# Patient Record
Sex: Male | Born: 1985 | Race: Black or African American | Hispanic: No | Marital: Married | Smoking: Current every day smoker
Health system: Southern US, Community
[De-identification: ages and names within clinical notes are randomized; demographics above are authoritative.]

---

## 2004-06-10 ENCOUNTER — Emergency Department (HOSPITAL_COMMUNITY): Admission: EM | Admit: 2004-06-10 | Discharge: 2004-06-10 | Payer: Self-pay | Admitting: Emergency Medicine

## 2006-04-07 ENCOUNTER — Emergency Department (HOSPITAL_COMMUNITY): Admission: EM | Admit: 2006-04-07 | Discharge: 2006-04-07 | Payer: Self-pay | Admitting: Emergency Medicine

## 2010-03-13 ENCOUNTER — Emergency Department (HOSPITAL_COMMUNITY): Admission: EM | Admit: 2010-03-13 | Discharge: 2010-03-13 | Payer: Self-pay | Admitting: Emergency Medicine

## 2010-08-22 LAB — POCT I-STAT, CHEM 8
Calcium, Ion: 1.15 mmol/L (ref 1.12–1.32)
Glucose, Bld: 81 mg/dL (ref 70–99)
HCT: 48 % (ref 39.0–52.0)
Hemoglobin: 16.3 g/dL (ref 13.0–17.0)
Potassium: 3.8 mEq/L (ref 3.5–5.1)

## 2011-09-06 ENCOUNTER — Ambulatory Visit (INDEPENDENT_AMBULATORY_CARE_PROVIDER_SITE_OTHER): Payer: 59 | Admitting: Family Medicine

## 2011-09-06 VITALS — BP 100/67 | HR 56 | Temp 98.2°F | Resp 18 | Ht 73.0 in | Wt 160.8 lb

## 2011-09-06 DIAGNOSIS — R509 Fever, unspecified: Secondary | ICD-10-CM

## 2011-09-06 DIAGNOSIS — Z720 Tobacco use: Secondary | ICD-10-CM

## 2011-09-06 DIAGNOSIS — K219 Gastro-esophageal reflux disease without esophagitis: Secondary | ICD-10-CM

## 2011-09-06 DIAGNOSIS — B9789 Other viral agents as the cause of diseases classified elsewhere: Secondary | ICD-10-CM

## 2011-09-06 MED ORDER — MELOXICAM 7.5 MG PO TABS: 7.5000 mg | ORAL_TABLET | Freq: Two times a day (BID) | ORAL | Status: AC

## 2011-09-06 MED ORDER — HYDROCODONE-HOMATROPINE 5-1.5 MG/5ML PO SYRP: 5.0000 mL | ORAL_SOLUTION | Freq: Three times a day (TID) | ORAL | Status: AC | PRN

## 2011-09-06 NOTE — Progress Notes (Signed)
  Subjective:    Patient ID: Dalton Cortez, male    DOB: Nov 08, 1985, 26 y.o.   MRN: [id number]  HPI  Patient presents with fever, ST and cough. Cough worse at night and is nonproductive Cabin crew with influenza  Never with history of seasonal allergies or wheezing History of GERD; daily Prilosec(OTC) to control symptoms Review of Systems     Objective:   Physical Exam    Results for orders placed in visit on 09/06/11  POCT RAPID STREP A (OFFICE)      Component Value Range   Rapid Strep A Screen Negative  Negative   POCT INFLUENZA A/B      Component Value Range   Influenza A, POC Negative     Influenza B, POC Negative         Assessment & Plan:   1. GERD (gastroesophageal reflux disease)    2. Viral illness  POCT rapid strep A, POCT Influenza A/B, meloxicam (MOBIC) 7.5 MG tablet, HYDROcodone-homatropine (HYCODAN) 5-1.5 MG/5ML syrup   Tamiflu 75mg  BID X 5 days Anticipatory guidance Call or RTC if symptoms persist or worsen

## 2011-12-27 ENCOUNTER — Ambulatory Visit (INDEPENDENT_AMBULATORY_CARE_PROVIDER_SITE_OTHER): Payer: 59 | Admitting: Emergency Medicine

## 2011-12-27 VITALS — BP 120/68 | HR 63 | Temp 98.0°F | Resp 17 | Ht 72.0 in | Wt 155.0 lb

## 2011-12-27 DIAGNOSIS — S0500XA Injury of conjunctiva and corneal abrasion without foreign body, unspecified eye, initial encounter: Secondary | ICD-10-CM

## 2011-12-27 DIAGNOSIS — S058X9A Other injuries of unspecified eye and orbit, initial encounter: Secondary | ICD-10-CM

## 2011-12-27 MED ORDER — TOBRAMYCIN 0.3 % OP SOLN: 1.0000 [drp] | OPHTHALMIC | Status: AC

## 2011-12-27 NOTE — Progress Notes (Signed)
   Date:  12/27/2011   Name:  Dalton Cortez   DOB:  May 23, 1986   MRN:  [id number]  PCP:  No primary provider on file.    Chief Complaint: Eye Pain   History of Present Illness:  Dalton Cortez is a 26 y.o. very pleasant male patient who presents with the following:  No history of injury or FB in left eye.  Awoke this morning with pain but no FB sensation.  Has been watering.  Pain less.  Red   Patient Active Problem List  Diagnosis  . GERD (gastroesophageal reflux disease)  . Tobacco abuse    No past medical history on file.  No past surgical history on file.  History  Substance Use Topics  . Smoking status: Current Everyday Smoker -- 0.5 packs/day for 10 years    Types: Cigarettes  . Smokeless tobacco: Not on file  . Alcohol Use: Not on file    No family history on file.  No Known Allergies  Medication list has been reviewed and updated.  Current Outpatient Prescriptions on File Prior to Visit  Medication Sig Dispense Refill  . meloxicam (MOBIC) 7.5 MG tablet Take 1 tablet (7.5 mg total) by mouth 2 (two) times daily.  30 tablet  0  . omeprazole (PRILOSEC) 20 MG capsule Take 20 mg by mouth daily.        Review of Systems:  As per HPI, otherwise negative.    Physical Examination: Filed Vitals:   12/27/11 1354  BP: 120/68  Pulse: 63  Temp: 98 F (36.7 C)  Resp: 17   Filed Vitals:   12/27/11 1354  Height: 6' (1.829 m)  Weight: 155 lb (70.308 kg)   Body mass index is 21.02 kg/(m^2). Ideal Body Weight: Weight in (lb) to have BMI = 25: 183.9    GEN: WDWN, NAD, Non-toxic, Alert & Oriented x 3 HEENT: Atraumatic, Normocephalic.  Ears and Nose: No external deformity. EXTR: No clubbing/cyanosis/edema NEURO: Normal gait.  PSYCH: Normally interactive. Conversant. Not depressed or anxious appearing.  Calm demeanor.  Left eye injected.  No abrasion or fb.  Fluorescein negative  Assessment and Plan: Healing abrasion vs conjunctivitis tobrex Follow up as  needed Carmelina Dane, MD

## 2012-08-28 ENCOUNTER — Ambulatory Visit (INDEPENDENT_AMBULATORY_CARE_PROVIDER_SITE_OTHER): Payer: BC Managed Care – PPO | Admitting: Family Medicine

## 2012-08-28 ENCOUNTER — Ambulatory Visit: Payer: BC Managed Care – PPO

## 2012-08-28 VITALS — BP 124/76 | HR 64 | Temp 98.2°F | Resp 16 | Ht 72.0 in | Wt 165.4 lb

## 2012-08-28 DIAGNOSIS — IMO0002 Reserved for concepts with insufficient information to code with codable children: Secondary | ICD-10-CM

## 2012-08-28 DIAGNOSIS — R509 Fever, unspecified: Secondary | ICD-10-CM

## 2012-08-28 DIAGNOSIS — M79609 Pain in unspecified limb: Secondary | ICD-10-CM

## 2012-08-28 DIAGNOSIS — M79645 Pain in left finger(s): Secondary | ICD-10-CM

## 2012-08-28 DIAGNOSIS — S62609A Fracture of unspecified phalanx of unspecified finger, initial encounter for closed fracture: Secondary | ICD-10-CM

## 2012-08-28 MED ORDER — HYDROCODONE-ACETAMINOPHEN 5-325 MG PO TABS: 1.0000 | ORAL_TABLET | Freq: Four times a day (QID) | ORAL | Status: DC | PRN
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
## 2012-08-28 NOTE — Patient Instructions (Addendum)
Return in 2 weeks to re-x-ray the finger and insure proper healing.  Keep the splint on 24-7

## 2012-09-11 ENCOUNTER — Ambulatory Visit: Payer: BC Managed Care – PPO

## 2012-11-12 ENCOUNTER — Encounter (HOSPITAL_COMMUNITY): Payer: Self-pay

## 2012-11-12 ENCOUNTER — Emergency Department (HOSPITAL_COMMUNITY)
Admission: EM | Admit: 2012-11-12 | Discharge: 2012-11-12 | Disposition: A | Discharge status: Home / Self Care | Payer: BC Managed Care – PPO | Attending: Emergency Medicine | Admitting: Emergency Medicine

## 2012-11-12 DIAGNOSIS — Z79899 Other long term (current) drug therapy: Secondary | ICD-10-CM | POA: Insufficient documentation

## 2012-11-12 DIAGNOSIS — R05 Cough: Secondary | ICD-10-CM | POA: Insufficient documentation

## 2012-11-12 DIAGNOSIS — F17200 Nicotine dependence, unspecified, uncomplicated: Secondary | ICD-10-CM | POA: Insufficient documentation

## 2012-11-12 DIAGNOSIS — R059 Cough, unspecified: Secondary | ICD-10-CM | POA: Insufficient documentation

## 2012-11-12 DIAGNOSIS — IMO0001 Reserved for inherently not codable concepts without codable children: Secondary | ICD-10-CM | POA: Insufficient documentation

## 2012-11-12 DIAGNOSIS — J069 Acute upper respiratory infection, unspecified: Secondary | ICD-10-CM

## 2012-11-12 DIAGNOSIS — J3489 Other specified disorders of nose and nasal sinuses: Secondary | ICD-10-CM | POA: Insufficient documentation

## 2012-11-12 MED ORDER — KETOROLAC TROMETHAMINE 60 MG/2ML IM SOLN
60.0000 mg | Freq: Once | INTRAMUSCULAR | Status: AC
  Administered 2012-11-12: 60 mg via INTRAMUSCULAR
  Filled 2012-11-12: qty 2

## 2012-11-12 MED ORDER — PREDNISONE 50 MG PO TABS: 50.0000 mg | ORAL_TABLET | Freq: Every day | ORAL | Status: DC

## 2012-11-12 MED ORDER — GUAIFENESIN ER 1200 MG PO TB12: 1.0000 | ORAL_TABLET | Freq: Two times a day (BID) | ORAL | Status: DC

## 2012-11-12 MED ORDER — ACETAMINOPHEN-CODEINE 120-12 MG/5ML PO SOLN: 10.0000 mL | ORAL | Status: DC | PRN

## 2012-11-12 NOTE — ED Provider Notes (Signed)
History     CSN: [id number]  Arrival date & time 11/12/12  1156   First MD Initiated Contact with Patient 11/12/12 1227      Chief Complaint  Patient presents with  . Headache  . Generalized Body Aches    (Consider location/radiation/quality/duration/timing/severity/associated sxs/prior treatment) HPI Patient presents emergency department with nasal congestion, cough, and headache.  Patient, states, that the symptoms started Wednesday evening.  Patient denies chest pain, shortness of breath, nausea, vomiting, abdominal pain, back pain, dysuria, blurred vision, dizziness, or syncope.  Patient, states he took 400 mg of ibuprofen with some relief of his headache.  The symptoms have been constant over that time    History reviewed. No pertinent past medical history.  History reviewed. No pertinent past surgical history.  History reviewed. No pertinent family history.  History  Substance Use Topics  . Smoking status: Current Every Day Smoker -- 0.50 packs/day for 10 years    Types: Cigarettes  . Smokeless tobacco: Never Used  . Alcohol Use: 7.2 oz/week    12 Cans of beer per week     Comment: socially      Review of Systems All other systems negative except as documented in the HPI. All pertinent positives and negatives as reviewed in the HPI. Allergies  Review of patient's allergies indicates no known allergies.  Home Medications   Current Outpatient Rx  Name  Route  Sig  Dispense  Refill  . ibuprofen (ADVIL,MOTRIN) 200 MG tablet   Oral   Take 400 mg by mouth every 6 (six) hours as needed for pain.         Marland Kitchen omeprazole (PRILOSEC) 20 MG capsule   Oral   Take 20 mg by mouth daily.           BP 119/72  Pulse 88  Temp(Src) 99 F (37.2 C) (Oral)  Resp 18  Ht 6\' 1"  (1.854 m)  Wt 165 lb (74.844 kg)  BMI 21.77 kg/m2  SpO2 100%  Physical Exam  Nursing note and vitals reviewed. Constitutional: He is oriented to person, place, and time. He appears  well-developed and well-nourished. No distress.  HENT:  Head: Normocephalic and atraumatic.  Nose: Rhinorrhea present.  Mouth/Throat: Oropharynx is clear and moist.  Eyes: EOM are normal. Pupils are equal, round, and reactive to light.  Neck: Normal range of motion. Neck supple.  Cardiovascular: Normal rate, regular rhythm and normal heart sounds.  Exam reveals no gallop and no friction rub.   No murmur heard. Pulmonary/Chest: Effort normal and breath sounds normal. No respiratory distress.  Neurological: He is alert and oriented to person, place, and time.  Skin: Skin is warm and dry. No rash noted.    ED Course  Procedures (including critical care time) Patient most likely has viral URI.  Told to increase fluid intake, rest as much possible.  Told to return here as needed   MDM          Carlyle Dolly, PA-C 11/12/12 1248

## 2012-11-12 NOTE — ED Notes (Signed)
Patient reports that he has had a headache, clear nasal drainage, body aches, and chills. Patiuent states that he took Advil 400 mg piror to arriving to the ED because he felt hot.

## 2012-11-16 NOTE — ED Provider Notes (Signed)
Medical screening examination/treatment/procedure(s) were performed by non-physician practitioner and as supervising physician I was immediately available for consultation/collaboration.  Toy Baker, MD 11/16/12 2009

## 2013-04-14 ENCOUNTER — Other Ambulatory Visit: Payer: Self-pay

## 2015-12-13 ENCOUNTER — Emergency Department (HOSPITAL_COMMUNITY): Payer: BLUE CROSS/BLUE SHIELD

## 2015-12-13 ENCOUNTER — Emergency Department (HOSPITAL_COMMUNITY)
Admission: EM | Admit: 2015-12-13 | Discharge: 2015-12-13 | Disposition: A | Discharge status: Home / Self Care | Payer: BLUE CROSS/BLUE SHIELD | Attending: Emergency Medicine | Admitting: Emergency Medicine

## 2015-12-13 ENCOUNTER — Encounter (HOSPITAL_COMMUNITY): Payer: Self-pay | Admitting: Emergency Medicine

## 2015-12-13 DIAGNOSIS — Y999 Unspecified external cause status: Secondary | ICD-10-CM | POA: Insufficient documentation

## 2015-12-13 DIAGNOSIS — F17210 Nicotine dependence, cigarettes, uncomplicated: Secondary | ICD-10-CM | POA: Insufficient documentation

## 2015-12-13 DIAGNOSIS — Y929 Unspecified place or not applicable: Secondary | ICD-10-CM | POA: Insufficient documentation

## 2015-12-13 DIAGNOSIS — S62316A Displaced fracture of base of fifth metacarpal bone, right hand, initial encounter for closed fracture: Secondary | ICD-10-CM | POA: Diagnosis not present

## 2015-12-13 DIAGNOSIS — S6991XA Unspecified injury of right wrist, hand and finger(s), initial encounter: Secondary | ICD-10-CM | POA: Diagnosis present

## 2015-12-13 DIAGNOSIS — Y939 Activity, unspecified: Secondary | ICD-10-CM | POA: Insufficient documentation

## 2015-12-13 DIAGNOSIS — W228XXA Striking against or struck by other objects, initial encounter: Secondary | ICD-10-CM | POA: Insufficient documentation

## 2015-12-13 DIAGNOSIS — S62308A Unspecified fracture of other metacarpal bone, initial encounter for closed fracture: Secondary | ICD-10-CM

## 2015-12-13 MED ORDER — KETOROLAC TROMETHAMINE 60 MG/2ML IM SOLN
60.0000 mg | Freq: Once | INTRAMUSCULAR | Status: AC
Start: 2015-12-13 — End: 2015-12-13
  Administered 2015-12-13: 60 mg via INTRAMUSCULAR
  Filled 2015-12-13: qty 2

## 2015-12-13 MED ORDER — OXYCODONE-ACETAMINOPHEN 5-325 MG PO TABS: 1.0000 | ORAL_TABLET | ORAL | Status: DC | PRN

## 2015-12-13 MED ORDER — IBUPROFEN 600 MG PO TABS: 600.0000 mg | ORAL_TABLET | Freq: Three times a day (TID) | ORAL | Status: DC
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
## 2015-12-13 NOTE — Progress Notes (Signed)
Orthopedic Tech Progress Note Patient Details:  Dalton Cortez 01/16/1986 [id number]  Ortho Devices Type of Ortho Device: Ace wrap, Ulna gutter splint Ortho Device/Splint Interventions: Application   [name] 12/13/2015, 9:33 AM

## 2015-12-17 ENCOUNTER — Other Ambulatory Visit: Payer: Self-pay | Admitting: Orthopaedic Surgery

## 2015-12-17 ENCOUNTER — Ambulatory Visit
Admission: RE | Admit: 2015-12-17 | Discharge: 2015-12-17 | Disposition: A | Discharge status: Home / Self Care | Payer: BLUE CROSS/BLUE SHIELD | Source: Ambulatory Visit | Attending: Orthopaedic Surgery | Admitting: Orthopaedic Surgery

## 2015-12-17 DIAGNOSIS — M79641 Pain in right hand: Secondary | ICD-10-CM

## 2016-10-13 ENCOUNTER — Encounter: Payer: Self-pay | Admitting: Urgent Care

## 2016-10-13 ENCOUNTER — Ambulatory Visit (INDEPENDENT_AMBULATORY_CARE_PROVIDER_SITE_OTHER): Payer: BLUE CROSS/BLUE SHIELD | Admitting: Urgent Care

## 2016-10-13 VITALS — BP 102/59 | HR 71 | Temp 98.3°F | Resp 16 | Ht 71.5 in | Wt 149.8 lb

## 2016-10-13 DIAGNOSIS — K029 Dental caries, unspecified: Secondary | ICD-10-CM

## 2016-10-13 DIAGNOSIS — R220 Localized swelling, mass and lump, head: Secondary | ICD-10-CM

## 2016-10-13 DIAGNOSIS — K0889 Other specified disorders of teeth and supporting structures: Secondary | ICD-10-CM | POA: Diagnosis not present

## 2016-10-13 MED ORDER — NAPROXEN SODIUM 550 MG PO TABS: 550.0000 mg | ORAL_TABLET | Freq: Two times a day (BID) | ORAL | 1 refills | Status: AC

## 2016-10-13 MED ORDER — AMOXICILLIN-POT CLAVULANATE 875-125 MG PO TABS: 1.0000 | ORAL_TABLET | Freq: Two times a day (BID) | ORAL | 0 refills | Status: AC

## 2016-10-13 NOTE — Patient Instructions (Addendum)
Please make sure you can set up an appointment with a dentist for management of a possible dental abscess. If you are not able to get an appointment this week, you must report to the ER immediately.   Dental Abscess A dental abscess is a collection of pus in or around a tooth. What are the causes? This condition is caused by a bacterial infection around the root of the tooth that involves the inner part of the tooth (pulp). It may result from:  Severe tooth decay.  Trauma to the tooth that allows bacteria to enter into the pulp, such as a broken or chipped tooth.  Severe gum disease around a tooth. What are the signs or symptoms? Symptoms of this condition include:  Severe pain in and around the infected tooth.  Swelling and redness around the infected tooth, in the mouth, or in the face.  Tenderness.  Pus drainage.  Bad breath.  Bitter taste in the mouth.  Difficulty swallowing.  Difficulty opening the mouth.  Nausea.  Vomiting.  Chills.  Swollen neck glands.  Fever. How is this diagnosed? This condition is diagnosed with examination of the infected tooth. During the exam, your dentist may tap on the infected tooth. Your dentist will also ask about your medical and dental history and may order X-rays. How is this treated? This condition is treated by eliminating the infection. This may be done with:  Antibiotic medicine.  A root canal. This may be performed to save the tooth.  Pulling (extracting) the tooth. This may also involve draining the abscess. This is done if the tooth cannot be saved. Follow these instructions at home:  Take medicines only as directed by your dentist.  If you were prescribed antibiotic medicine, finish all of it even if you start to feel better.  Rinse your mouth (gargle) often with salt water to relieve pain or swelling.  Do not drive or operate heavy machinery while taking pain medicine.  Do not apply heat to the outside of  your mouth.  Keep all follow-up visits as directed by your dentist. This is important. Contact a health care provider if:  Your pain is worse and is not helped by medicine. Get help right away if:  You have a fever or chills.  Your symptoms suddenly get worse.  You have a very bad headache.  You have problems breathing or swallowing.  You have trouble opening your mouth.  You have swelling in your neck or around your eye. This information is not intended to replace advice given to you by your health care provider. Make sure you discuss any questions you have with your health care provider. Document Released: 05/26/2005 Document Revised: 10/04/2015 Document Reviewed: 05/23/2014 Elsevier Interactive Patient Education  2017 [employer]     IF you received an x-ray today, you will receive an invoice from [facility]. Please contact [facility] at [phone] with questions or concerns regarding your invoice.   IF you received labwork today, you will receive an invoice from [facility]. Please contact [facility] at [phone] with questions or concerns regarding your invoice.   Our billing staff will not be able to assist you with questions regarding bills from these companies.  You will be contacted with the lab results as soon as they are available. The fastest way to get your results is to activate your My Chart account. Instructions are located on the last page of this paperwork. If you have not heard from us regarding the results in  2 weeks, please contact this office.

## 2016-10-13 NOTE — Progress Notes (Signed)
  MRN: [id number] DOB: 1985/10/04  Subjective:   Dalton Cortez is a 31 y.o. male presenting for chief complaint of Dental Pain (with swelling, left side)  Reports 5 day history of pain, worsened to swelling of his left lower teeth. Has had subjective fever, difficulty chewing. Has been drinking water daily. He has been to a dental office, recommended that he come in to a primary care office for an antibiotic course.   Dalton Cortez has a current medication list which includes the following prescription(s): omeprazole. Also has No Known Allergies. Dalton Cortez denies past medical and surgical history.   Objective:   Vitals: BP (!) 102/59 (BP Location: Right Arm, Patient Position: Sitting, Cuff Size: Normal)   Pulse 71   Temp 98.3 F (36.8 C) (Oral)   Resp 16   Ht 5' 11.5" (1.816 m)   Wt 149 lb 12.8 oz (67.9 kg)   SpO2 93%   BMI 20.60 kg/m   Physical Exam  Constitutional: He is oriented to person, place, and time. He appears well-developed and well-nourished.  HENT:  Head:    Mouth/Throat: Dental caries (over areas outlined) present.    Cardiovascular: Normal rate.   Pulmonary/Chest: Effort normal.  Neurological: He is alert and oriented to person, place, and time.   Assessment and Plan :   1. Pain, dental 2. Dental caries 3. Facial swelling - Start Augmentin as per UpToDate. Emphasized importance of setting up an appointment with a dentist. Otherwise, report to ER. Patient verbalized understanding.  Wallis BambergMario Kensleigh Gates, PA-C [facility] [phone] 10/13/2016  2:12 PM

## 2018-11-24 ENCOUNTER — Emergency Department (HOSPITAL_COMMUNITY): Payer: BLUE CROSS/BLUE SHIELD

## 2018-11-24 ENCOUNTER — Encounter (HOSPITAL_COMMUNITY): Payer: Self-pay | Admitting: *Deleted

## 2018-11-24 ENCOUNTER — Other Ambulatory Visit: Payer: Self-pay

## 2018-11-24 ENCOUNTER — Inpatient Hospital Stay (HOSPITAL_COMMUNITY)
Admission: EM | Admit: 2018-11-24 | Discharge: 2018-11-27 | DRG: 199 | Disposition: A | Discharge status: Home / Self Care | Payer: BLUE CROSS/BLUE SHIELD | Attending: General Surgery | Admitting: General Surgery

## 2018-11-24 DIAGNOSIS — S41112A Laceration without foreign body of left upper arm, initial encounter: Secondary | ICD-10-CM | POA: Diagnosis present

## 2018-11-24 DIAGNOSIS — S270XXA Traumatic pneumothorax, initial encounter: Secondary | ICD-10-CM

## 2018-11-24 DIAGNOSIS — S272XXA Traumatic hemopneumothorax, initial encounter: Principal | ICD-10-CM | POA: Diagnosis present

## 2018-11-24 DIAGNOSIS — J939 Pneumothorax, unspecified: Secondary | ICD-10-CM

## 2018-11-24 DIAGNOSIS — Z1159 Encounter for screening for other viral diseases: Secondary | ICD-10-CM

## 2018-11-24 DIAGNOSIS — S21412A Laceration without foreign body of left back wall of thorax with penetration into thoracic cavity, initial encounter: Secondary | ICD-10-CM | POA: Diagnosis present

## 2018-11-24 DIAGNOSIS — S21212A Laceration without foreign body of left back wall of thorax without penetration into thoracic cavity, initial encounter: Secondary | ICD-10-CM

## 2018-11-24 DIAGNOSIS — T148XXA Other injury of unspecified body region, initial encounter: Secondary | ICD-10-CM | POA: Diagnosis present

## 2018-11-24 DIAGNOSIS — D62 Acute posthemorrhagic anemia: Secondary | ICD-10-CM | POA: Diagnosis present

## 2018-11-24 LAB — I-STAT CHEM 8, ED
BUN: 16 mg/dL (ref 6–20)
Calcium, Ion: 1.08 mmol/L — ABNORMAL LOW (ref 1.15–1.40)
Chloride: 103 mmol/L (ref 98–111)
Creatinine, Ser: 0.8 mg/dL (ref 0.61–1.24)
Glucose, Bld: 124 mg/dL — ABNORMAL HIGH (ref 70–99)
HCT: 42 % (ref 39.0–52.0)
Hemoglobin: 14.3 g/dL (ref 13.0–17.0)
Potassium: 3.3 mmol/L — ABNORMAL LOW (ref 3.5–5.1)
Sodium: 139 mmol/L (ref 135–145)
TCO2: 24 mmol/L (ref 22–32)

## 2018-11-24 LAB — ABO/RH: ABO/RH(D): B POS

## 2018-11-24 MED ORDER — ONDANSETRON 4 MG PO TBDP: 4.0000 mg | ORAL_TABLET | Freq: Four times a day (QID) | ORAL | Status: DC | PRN

## 2018-11-24 MED ORDER — HYDROMORPHONE HCL 1 MG/ML IJ SOLN
0.5000 mg | INTRAMUSCULAR | Status: DC | PRN
  Administered 2018-11-24 – 2018-11-25 (×4): 0.5 mg via INTRAVENOUS
  Filled 2018-11-24 (×3): qty 0.5
  Filled 2018-11-24: qty 1

## 2018-11-24 MED ORDER — ONDANSETRON HCL 4 MG/2ML IJ SOLN
4.0000 mg | Freq: Four times a day (QID) | INTRAMUSCULAR | Status: DC | PRN
  Administered 2018-11-24: 4 mg via INTRAVENOUS
  Filled 2018-11-24: qty 2

## 2018-11-24 MED ORDER — IOHEXOL 350 MG/ML SOLN
100.0000 mL | Freq: Once | INTRAVENOUS | Status: AC | PRN
  Administered 2018-11-24: 100 mL via INTRAVENOUS

## 2018-11-24 NOTE — ED Notes (Signed)
Dr Donne Hazel at  The bedside.  Sutures in the lt arm and the p;osterior chest laceration

## 2018-11-24 NOTE — ED Notes (Signed)
Chest  Tube inserted by dr Delight Hoh chest  ivsx x 2  One in each arm  Bleeding from atabwound lt arm  Lt posterior chest

## 2018-11-24 NOTE — ED Notes (Signed)
2125 t0 c-t  Returned from c-t  C/o pain  Pt remaions alert
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
## 2018-11-24 NOTE — ED Notes (Signed)
bp 90/ 62

## 2018-11-24 NOTE — ED Notes (Signed)
The pt is feeling better  His color is better  And he has stopped sweating  He remains alert
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
## 2018-11-24 NOTE — ED Notes (Signed)
Pt remains alert sweating profuse now
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
## 2018-11-24 NOTE — ED Notes (Signed)
Nasal 02 decreased to 2 liters sats in the 100s
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
## 2018-11-25 ENCOUNTER — Other Ambulatory Visit: Payer: Self-pay

## 2018-11-25 ENCOUNTER — Observation Stay (HOSPITAL_COMMUNITY): Payer: BLUE CROSS/BLUE SHIELD

## 2018-11-25 ENCOUNTER — Encounter (HOSPITAL_COMMUNITY): Payer: Self-pay | Admitting: Certified Registered Nurse Anesthetist

## 2018-11-25 DIAGNOSIS — S270XXA Traumatic pneumothorax, initial encounter: Secondary | ICD-10-CM | POA: Diagnosis present

## 2018-11-25 DIAGNOSIS — D62 Acute posthemorrhagic anemia: Secondary | ICD-10-CM | POA: Diagnosis present

## 2018-11-25 DIAGNOSIS — S272XXA Traumatic hemopneumothorax, initial encounter: Secondary | ICD-10-CM | POA: Diagnosis present

## 2018-11-25 DIAGNOSIS — S21412A Laceration without foreign body of left back wall of thorax with penetration into thoracic cavity, initial encounter: Secondary | ICD-10-CM | POA: Diagnosis present

## 2018-11-25 DIAGNOSIS — S41112A Laceration without foreign body of left upper arm, initial encounter: Secondary | ICD-10-CM | POA: Diagnosis present

## 2018-11-25 DIAGNOSIS — Z1159 Encounter for screening for other viral diseases: Secondary | ICD-10-CM | POA: Diagnosis not present

## 2018-11-25 LAB — BPAM RBC
Blood Product Expiration Date: 202007162359
Blood Product Expiration Date: 202007162359
Blood Product Expiration Date: 202007182359
Blood Product Expiration Date: 202007182359
ISSUE DATE / TIME: 202006172041
ISSUE DATE / TIME: 202006172041
ISSUE DATE / TIME: 202006180131
ISSUE DATE / TIME: 202006180432
Unit Type and Rh: 5100
Unit Type and Rh: 5100
Unit Type and Rh: 5100
Unit Type and Rh: 5100

## 2018-11-25 LAB — PREPARE FRESH FROZEN PLASMA
Unit division: 0
Unit division: 0
Unit division: 0
Unit division: 0

## 2018-11-25 LAB — CBC
HCT: 33.5 % — ABNORMAL LOW (ref 39.0–52.0)
HCT: 32 % — ABNORMAL LOW (ref 39.0–52.0)
Hemoglobin: 11.3 g/dL — ABNORMAL LOW (ref 13.0–17.0)
Hemoglobin: 10.9 g/dL — ABNORMAL LOW (ref 13.0–17.0)
MCH: 30.7 pg (ref 26.0–34.0)
MCH: 30.8 pg (ref 26.0–34.0)
MCHC: 33.7 g/dL (ref 30.0–36.0)
MCHC: 34.1 g/dL (ref 30.0–36.0)
MCV: 91 fL (ref 80.0–100.0)
MCV: 90.4 fL (ref 80.0–100.0)
Platelets: 110 10*3/uL — ABNORMAL LOW (ref 150–400)
Platelets: 112 10*3/uL — ABNORMAL LOW (ref 150–400)
RBC: 3.68 MIL/uL — ABNORMAL LOW (ref 4.22–5.81)
RBC: 3.54 MIL/uL — ABNORMAL LOW (ref 4.22–5.81)
RDW: 12.6 % (ref 11.5–15.5)
RDW: 12.6 % (ref 11.5–15.5)
WBC: 11.6 10*3/uL — ABNORMAL HIGH (ref 4.0–10.5)
WBC: 9.3 10*3/uL (ref 4.0–10.5)
nRBC: 0 % (ref 0.0–0.2)
nRBC: 0 % (ref 0.0–0.2)

## 2018-11-25 LAB — TYPE AND SCREEN
ABO/RH(D): B POS
Antibody Screen: NEGATIVE
Unit division: 0
Unit division: 0
Unit division: 0
Unit division: 0

## 2018-11-25 LAB — BPAM FFP
Blood Product Expiration Date: 202007042359
Blood Product Expiration Date: 202007042359
Blood Product Expiration Date: 202007092359
Blood Product Expiration Date: 202007092359
ISSUE DATE / TIME: 202006172041
ISSUE DATE / TIME: 202006172041
ISSUE DATE / TIME: 202006172121
ISSUE DATE / TIME: 202006172121
Unit Type and Rh: 6200
Unit Type and Rh: 6200
Unit Type and Rh: 6200
Unit Type and Rh: 6200

## 2018-11-25 LAB — SARS CORONAVIRUS 2: SARS Coronavirus 2: NOT DETECTED

## 2018-11-25 LAB — BASIC METABOLIC PANEL
Anion gap: 7 (ref 5–15)
BUN: 12 mg/dL (ref 6–20)
CO2: 26 mmol/L (ref 22–32)
Calcium: 8 mg/dL — ABNORMAL LOW (ref 8.9–10.3)
Chloride: 106 mmol/L (ref 98–111)
Creatinine, Ser: 0.72 mg/dL (ref 0.61–1.24)
GFR calc Af Amer: >60 mL/min (ref >60)
GFR calc non Af Amer: >60 mL/min (ref >60)
Glucose, Bld: 95 mg/dL (ref 70–99)
Potassium: 3.9 mmol/L (ref 3.5–5.1)
Sodium: 139 mmol/L (ref 135–145)

## 2018-11-25 LAB — BLOOD PRODUCT ORDER (VERBAL) VERIFICATION

## 2018-11-25 LAB — MRSA PCR SCREENING: MRSA by PCR: NEGATIVE

## 2018-11-25 MED ORDER — HYDROMORPHONE HCL 1 MG/ML IJ SOLN
1.0000 mg | Freq: Once | INTRAMUSCULAR | Status: AC
  Administered 2018-11-25: 1 mg via INTRAVENOUS
  Filled 2018-11-25: qty 1

## 2018-11-25 MED ORDER — SODIUM CHLORIDE 0.9 % IV SOLN
INTRAVENOUS | Status: DC
  Administered 2018-11-25: 01:00:00 via INTRAVENOUS

## 2018-11-25 MED ORDER — ACETAMINOPHEN 325 MG PO TABS
650.0000 mg | ORAL_TABLET | ORAL | Status: DC | PRN
  Administered 2018-11-25 (×4): 650 mg via ORAL
  Filled 2018-11-25 (×4): qty 2

## 2018-11-25 MED ORDER — HYDROMORPHONE HCL 1 MG/ML IJ SOLN
1.0000 mg | INTRAMUSCULAR | Status: DC | PRN
  Administered 2018-11-25 – 2018-11-26 (×5): 1 mg via INTRAVENOUS
  Filled 2018-11-25 (×5): qty 1

## 2018-11-25 MED ORDER — LIDOCAINE HCL (PF) 1 % IJ SOLN
INTRAMUSCULAR | Status: AC
  Administered 2018-11-25: 09:00:00 via INTRADERMAL
  Filled 2018-11-25: qty 15

## 2018-11-25 MED ORDER — LIDOCAINE HCL 1 % IJ SOLN
15.0000 mL | INTRAMUSCULAR | Status: AC
  Administered 2018-11-25: 09:00:00 via INTRADERMAL
  Filled 2018-11-25: qty 15

## 2018-11-25 MED ORDER — CEFAZOLIN SODIUM-DEXTROSE 2-4 GM/100ML-% IV SOLN
2.0000 g | Freq: Once | INTRAVENOUS | Status: AC
  Administered 2018-11-25: 2 g via INTRAVENOUS
  Filled 2018-11-25: qty 100

## 2018-11-25 MED ORDER — OXYCODONE HCL 5 MG PO TABS
10.0000 mg | ORAL_TABLET | ORAL | Status: DC | PRN
  Administered 2018-11-25 – 2018-11-27 (×4): 10 mg via ORAL
  Filled 2018-11-25 (×4): qty 2

## 2018-11-25 MED ORDER — OXYCODONE HCL 5 MG PO TABS
5.0000 mg | ORAL_TABLET | ORAL | Status: DC | PRN
  Administered 2018-11-25 (×2): 5 mg via ORAL
  Filled 2018-11-25 (×2): qty 1

## 2018-11-25 MED ORDER — PNEUMOCOCCAL VAC POLYVALENT 25 MCG/0.5ML IJ INJ: 0.5000 mL | INJECTION | INTRAMUSCULAR | Status: DC | PRN

## 2018-11-25 MED ORDER — CHLORHEXIDINE GLUCONATE CLOTH 2 % EX PADS
6.0000 | MEDICATED_PAD | Freq: Every day | CUTANEOUS | Status: DC
  Administered 2018-11-25 – 2018-11-27 (×3): 6 via TOPICAL

## 2018-11-25 NOTE — ED Notes (Signed)
Unsuccessful attempt to call report to 2h  rn is weaning another pt will call back in 10 minutes
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
## 2018-11-25 NOTE — ED Notes (Signed)
Nasal 02 removed pts sats are 96%
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
## 2018-11-25 NOTE — ED Notes (Signed)
Rep;ort given to rn on 2h 

## 2018-11-26 ENCOUNTER — Inpatient Hospital Stay (HOSPITAL_COMMUNITY): Payer: BLUE CROSS/BLUE SHIELD

## 2018-11-26 LAB — CBC
HCT: 33.5 % — ABNORMAL LOW (ref 39.0–52.0)
Hemoglobin: 11.5 g/dL — ABNORMAL LOW (ref 13.0–17.0)
MCH: 31.1 pg (ref 26.0–34.0)
MCHC: 34.3 g/dL (ref 30.0–36.0)
MCV: 90.5 fL (ref 80.0–100.0)
Platelets: 115 10*3/uL — ABNORMAL LOW (ref 150–400)
RBC: 3.7 MIL/uL — ABNORMAL LOW (ref 4.22–5.81)
RDW: 12.6 % (ref 11.5–15.5)
WBC: 7.5 10*3/uL (ref 4.0–10.5)
nRBC: 0 % (ref 0.0–0.2)

## 2018-11-26 MED ORDER — HYDROMORPHONE HCL 1 MG/ML IJ SOLN
1.0000 mg | INTRAMUSCULAR | Status: DC | PRN
  Administered 2018-11-26: 1 mg via INTRAVENOUS
  Filled 2018-11-26: qty 1

## 2018-11-26 MED ORDER — ACETAMINOPHEN 500 MG PO TABS
1000.0000 mg | ORAL_TABLET | Freq: Four times a day (QID) | ORAL | Status: DC
  Administered 2018-11-26 – 2018-11-27 (×5): 1000 mg via ORAL
  Filled 2018-11-26 (×5): qty 2

## 2018-11-26 MED ORDER — TRAMADOL HCL 50 MG PO TABS
50.0000 mg | ORAL_TABLET | Freq: Four times a day (QID) | ORAL | Status: DC
  Administered 2018-11-26 – 2018-11-27 (×6): 50 mg via ORAL
  Filled 2018-11-26 (×6): qty 1

## 2018-11-26 NOTE — Discharge Instructions (Signed)
PNEUMOTHORAX OR HEMOTHORAX HOME INSTRUCTIONS   1. PAIN CONTROL:  1. Pain is best controlled by a usual combination of three different methods TOGETHER:  i. Ice/Heat ii. Over the counter pain medication iii. Prescription pain medication 2. You may experience some swelling and bruising in area of broken ribs. Ice packs or heating pads (30-60 minutes up to 6 times a day) will help. Use ice for the first few days to help decrease swelling and bruising, then switch to heat to help relax tight/sore spots and speed recovery. Some people prefer to use ice alone, heat alone, alternating between ice & heat. Experiment to what works for you. Swelling and bruising can take several weeks to resolve.  3. It is helpful to take an over-the-counter pain medication regularly for the first few weeks. Choose one of the following that works best for you:  i. Naproxen (Aleve, etc) Two 220mg  tabs twice a day ii. Ibuprofen (Advil, etc) Three 200mg  tabs four times a day (every meal & bedtime) iii. Acetaminophen (Tylenol, etc) 500-650mg  four times a day (every meal & bedtime) 4. A prescription for pain medication (such as oxycodone, hydrocodone, etc) may be given to you upon discharge. Take your pain medication as prescribed.  i. If you are having problems/concerns with the prescription medicine (does not control pain, nausea, vomiting, rash, itching, etc), please call us [phone] to see if we need to switch you to a different pain medicine that will work better for you and/or control your side effect better. ii. If you need a refill on your pain medication, please contact your pharmacy. They will contact our office to request authorization. Prescriptions will not be filled after 5 pm or on week-ends. 1. Avoid getting constipated. When taking pain medications, it is common to experience some constipation. Increasing fluid intake and taking a fiber supplement (such as Metamucil, Citrucel, FiberCon, MiraLax, etc) 1-2  times a day regularly will usually help prevent this problem from occurring. A mild laxative (prune juice, Milk of Magnesia, MiraLax, etc) should be taken according to package directions if there are no bowel movements after 48 hours.  2. Watch out for diarrhea. If you have many loose bowel movements, simplify your diet to bland foods & liquids for a few days. Stop any stool softeners and decrease your fiber supplement. Switching to mild anti-diarrheal medications (Kayopectate, Pepto Bismol) can help. If this worsens or does not improve, please call us. 3. Chest tube site wound: you may remove the dressing from your chest tube site 3 days after the removal of your chest tube. DO NOT shower over the dressing. Once   removed, you may shower as normal. Do not submerge your wound in water for 2-3 weeks. Okay to shower over stab wounds then cover with clean gauze.  4. FOLLOW UP  a. Please call our office to set up or confirm an appointment for follow up for 2 weeks after discharge. You will need to [facility] at either [facility] or [facility]. This will be outlined in your follow up instructions. Please call [facility] at [phone] if you have any questions about follow up.  b. If you have any orthopedic or other injuries you will need to follow up as outlined in your follow up instructions.   WHEN TO CALL US [phone]) [phone]:  1. Poor pain control 2. Reactions / problems with new medications (rash/itching, nausea, etc)  3. Fever over 101.5 F (38.5 C) 4. Worsening swelling or bruising 5.  Redness, drainage, pain or swelling around chest tube site or stab wounds 6. Worsening pain, productive cough, difficulty breathing or any other concerning symptoms  The clinic staff is available to answer your questions during regular business hours (8:30am-5pm). Please dont hesitate to call and ask to speak to one of our nurses for clinical concerns.  If you have a medical emergency, go to the nearest  emergency room or call 911.  A surgeon from [facility] is always on call at the [facility]   [facility], [place]  [address], [address], [place], [place] [address] ?  MAIN: ([phone]) [phone] ? TOLL FREE: [phone] ?  FAX [phone](336) [phone]  [email or web address]      Information on Rib Fractures  A rib fracture is a break or crack in one of the bones of the ribs. The ribs are long, curved bones that wrap around your chest and attach to your spine and your breastbone. The ribs protect your heart, lungs, and other organs in the chest. A broken or cracked rib is often painful but is not usually serious. Most rib fractures heal on their own over time. However, rib fractures can be more serious if multiple ribs are broken or if broken ribs move out of place and push against other structures or organs. What are the causes? This condition is caused by:  Repetitive movements with high force, such as pitching a baseball or having severe coughing spells.  A direct blow to the chest, such as a sports injury, a car accident, or a fall.  Cancer that has spread to the bones, which can weaken bones and cause them to break. What are the signs or symptoms? Symptoms of this condition include:  Pain when you breathe in or cough.  Pain when someone presses on the injured area.  Feeling short of breath. How is this diagnosed? This condition is diagnosed with a physical exam and medical history. Imaging tests may also be done, such as:  Chest X-ray.  CT scan.  MRI.  Bone scan.  Chest ultrasound. How is this treated? Treatment for this condition depends on the severity of the fracture. Most rib fractures usually heal on their own in 1-3 months. Sometimes healing takes longer if there is a cough that does not stop or if there are other activities that make the injury worse (aggravating factors). While you heal, you will be given medicines to control the  pain. You will also be taught deep breathing exercises. Severe injuries may require hospitalization or surgery. Follow these instructions at home: Managing pain, stiffness, and swelling  If directed, apply ice to the injured area. ? Put ice in a plastic bag. ? Place a towel between your skin and the bag. ? Leave the ice on for 20 minutes, 2-3 times a day.  Take over-the-counter and prescription medicines only as told by your health care provider. Activity  Avoid a lot of activity and any activities or movements that cause pain. Be careful during activities and avoid bumping the injured rib.  Slowly increase your activity as told by your health care provider. General instructions  Do deep breathing exercises as told by your health care provider. This helps prevent pneumonia, which is a common complication of a broken rib. Your health care provider may instruct you to: ? Take deep breaths several times a day. ? Try to cough several times a day, holding a pillow against the injured area. ? Use a device called incentive spirometer to  practice deep breathing several times a day.  Drink enough fluid to keep your urine pale yellow.  Do not wear a rib belt or binder. These restrict breathing, which can lead to pneumonia.  Keep all follow-up visits as told by your health care provider. This is important. Contact a health care provider if:  You have a fever. Get help right away if:  You have difficulty breathing or you are short of breath.  You develop a cough that does not stop, or you cough up thick or bloody sputum.  You have nausea, vomiting, or pain in your abdomen.  Your pain gets worse and medicine does not help. Summary  A rib fracture is a break or crack in one of the bones of the ribs.  A broken or cracked rib is often painful but is not usually serious.  Most rib fractures heal on their own over time.  Treatment for this condition depends on the severity of the  fracture.  Avoid a lot of activity and any activities or movements that cause pain. This information is not intended to replace advice given to you by your health care provider. Make sure you discuss any questions you have with your health care provider. Document Released: 05/26/2005 Document Revised: 08/25/2016 Document Reviewed: 08/25/2016 Elsevier Interactive Patient Education  2019 [employer]    Pneumothorax A pneumothorax is commonly called a collapsed lung. It is a condition in which air leaks from a lung and builds up between the thin layer of tissue that covers the lungs (visceral pleura) and the interior wall of the chest cavity (parietal pleura). The air gets trapped outside the lung, between the lung and the chest wall (pleural space). The air takes up space and prevents the lung from fully expanding. This condition sometimes occurs suddenly with no apparent cause. The buildup of air may be small or large. A small pneumothorax may go away on its own. A large pneumothorax will require treatment and hospitalization. What are the causes? This condition may be caused by:  Trauma and injury to the chest wall.  Surgery and other medical procedures.  A complication of an underlying lung problem, especially chronic obstructive pulmonary disease (COPD) or emphysema. Sometimes the cause of this condition is not known. What increases the risk? You are more likely to develop this condition if:  You have an underlying lung problem.  You smoke.  You are 61-65 years old, male, tall, and underweight.  You have a personal or family history of pneumothorax.  You have an eating disorder (anorexia nervosa). This condition can also happen quickly, even in people with no history of lung problems. What are the signs or symptoms? Sometimes a pneumothorax will have no symptoms. When symptoms are present, they can include:  Chest pain.  Shortness of breath.  Increased rate of  breathing.  Bluish color to your lips or skin (cyanosis). How is this diagnosed? This condition may be diagnosed by:  A medical history and physical exam.  A chest X-ray, chest CT scan, or ultrasound. How is this treated? Treatment depends on how severe your condition is. The goal of treatment is to remove the extra air and allow your lung to expand back to its normal size.  For a small pneumothorax: ? No treatment may be needed. ? Extra oxygen is sometimes used to make it go away more quickly.  For a large pneumothorax or a pneumothorax that is causing symptoms, a procedure is done to drain the air from  your lungs. To do this, a health care provider may use: ? A needle with a syringe. This is used to suck air from a pleural space where no additional leakage is taking place. ? A chest tube. This is used to suck air where there is ongoing leakage into the pleural space. The chest tube may need to remain in place for several days until the air leak has healed.  In more severe cases, surgery may be needed to repair the damage that is causing the leak.  If you have multiple pneumothorax episodes or have an air leak that will not heal, a procedure called a pleurodesis may be done. A medicine is placed in the pleural space to irritate the tissues around the lung so that the lung will stick to the chest wall, seal any leaks, and stop any buildup of air in that space. If you have an underlying lung problem, severe symptoms, or a large pneumothorax you will usually need to stay in the hospital. Follow these instructions at home: Lifestyle  Do not use any products that contain nicotine or tobacco, such as cigarettes and e-cigarettes. These are major risk factors in pneumothorax. If you need help quitting, ask your health care provider.  Do not lift anything that is heavier than 10 lb (4.5 kg), or the limit that your health care provider tells you, until he or she says that it is safe.  Avoid  activities that take a lot of effort (strenuous) for as long as told by your health care provider.  Return to your normal activities as told by your health care provider. Ask your health care provider what activities are safe for you.  Do not fly in an airplane or scuba dive until your health care provider says it is okay. General instructions  Take over-the-counter and prescription medicines only as told by your health care provider.  If a cough or pain makes it difficult for you to sleep at night, try sleeping in a semi-upright position in a recliner or by using 2 or 3 pillows.  If you had a chest tube and it was removed, ask your health care provider when you can remove the bandage (dressing). While the dressing is in place, do not allow it to get wet.  Keep all follow-up visits as told by your health care provider. This is important. Contact a health care provider if:  You cough up thick mucus (sputum) that is yellow or green in color.  You were treated with a chest tube, and you have redness, increasing pain, or discharge at the site where it was placed. Get help right away if:  You have increasing chest pain or shortness of breath.  You have a cough that will not go away.  You begin coughing up blood.  You have pain that is getting worse or is not controlled with medicines.  The site where your chest tube was located opens up.  You feel air coming out of the site where the chest tube was placed.  You have a fever or persistent symptoms for more than 2-3 days.  You have a fever and your symptoms suddenly get worse. These symptoms may represent a serious problem that is an emergency. Do not wait to see if the symptoms will go away. Get medical help right away. Call your local emergency services (911 in the [place] Do not drive yourself to the hospital. Summary  A pneumothorax, commonly called a collapsed lung, is a condition in which  air leaks from a lung and gets trapped  between the lung and the chest wall (pleural space).  The buildup of air may be small or large. A small pneumothorax may go away on its own. A large pneumothorax will require treatment and hospitalization.  Treatment for this condition depends on how severe the pneumothorax is. The goal of treatment is to remove the extra air and allow the lung to expand back to its normal size. This information is not intended to replace advice given to you by your health care provider. Make sure you discuss any questions you have with your health care provider. Document Released: 05/26/2005 Document Revised: 05/04/2017 Document Reviewed: 05/04/2017 Elsevier Interactive Patient Education  2019 [employer] Inc.    Sutured Wound Care Sutures are stitches that can be used to close wounds. Taking care of your wound properly can help to prevent pain and infection. It can also help your wound to heal more quickly. Follow instructions from your health care provider about how to care for your sutured wound. Supplies needed:  Soap and water.  A clean bandage (dressing), if needed.  Antibiotic ointment.  A clean towel. How to care for your sutured wound   Keep the wound completely dry for the first 24 hours, or for as long as directed by your health care provider. After 24-48 hours, you may shower or bathe as directed by your health care provider. Do not soak or submerge the wound in water until the sutures have been removed.  After the first 24 hours, clean the wound once a day, or as often as directed by your health care provider, using the following steps: ? Wash the wound with soap and water. ? Rinse the wound with water to remove all soap. ? Pat the wound dry with a clean towel. Do not rub the wound.  After cleaning the wound, apply a thin layer of antibiotic ointment as directed by your health care provider. This will prevent infection and keep the dressing from sticking to the wound.  Follow  instructions from your health care provider about how to change your dressing: ? Wash your hands with soap and water. If soap and water are not available, use hand sanitizer. ? Change your dressing at least once a day, or as often as told by your health care provider. If your dressing gets wet or dirty, change it. ? Leave sutures and other skin closures, such as adhesive tape or skin glue, in place. These skin closures may need to stay in place for 2 weeks or longer. If adhesive strip edges start to loosen and curl up, you may trim the loose edges. Do not remove adhesive strips completely unless your health care provider tells you to do that.  Check your wound every day for signs of infection. Watch for: ? Redness, swelling, or pain. ? Fluid or blood. ? Warmth. ? Pus or a bad smell.  Have the sutures removed as directed by your health care provider. Follow these instructions at home: Medicines  Take or apply over-the-counter and prescription medicines only as told by your health care provider.  If you were prescribed an antibiotic medicine or ointment, take or apply it as told by your health care provider. Do not stop using the antibiotic even if your condition improves. General instructions  To help reduce scarring after your wound heals, cover your wound with clothing or apply sunscreen of at least 30 SPF whenever you are outside.  Do not scratch or pick  at your wound.  Avoid stretching your wound.  Raise (elevate) the injured area above the level of your heart while you are sitting or lying down, if possible.  Drink enough fluids to keep your urine clear or pale yellow.  Keep all follow-up visits as told by your health care provider. This is important. Contact a health care provider if:  You received a tetanus shot and you have swelling, severe pain, redness, or bleeding at the injection site.  Your wound breaks open.  You have redness, swelling, or pain around your  wound.  You have fluid or blood coming from your wound.  Your wound feels warm to the touch.  You have a fever.  You notice something coming out of your wound, such as wood or glass.  You have pain that does not get better with medicine.  The skin near your wound changes color.  You need to change your dressing very frequently due to a lot of fluid, blood, or pus draining from the wound.  You develop a new rash.  You develop numbness around the wound. Get help right away if:  You develop severe swelling around your wound.  You have pus or a bad smell coming from your wound.  Your pain suddenly gets worse and is severe.  You develop painful lumps near your wound or anywhere on your body.  You have a red streak going away from your wound.  The wound is on your hand or foot and: ? You cannot properly move a finger or toe. ? Your fingers or toes look pale or bluish. ? You have numbness that is spreading down your hand, foot, fingers, or toes. Summary  Sutures are stitches that can be used to close wounds.  Taking care of your wound properly can help to prevent pain and infection.  Keep the wound completely dry for the first 24 hours, or for as long as directed by your health care provider. After 24-48 hours, you may shower or bathe as directed by your health care provider. This information is not intended to replace advice given to you by your health care provider. Make sure you discuss any questions you have with your health care provider. Document Released: 07/03/2004 Document Revised: 07/01/2016 Document Reviewed: 07/01/2016 Elsevier Interactive Patient Education  2019 [employer].

## 2018-11-26 NOTE — Social Work (Signed)
CSW acknowledging consult "Pt requested. regarding reason for being in hospital."   Spoke with trauma PA Janett Billow who will see pt, defer to medical team to discuss injuries and care plan.  Westley Hummer, MSW, [place] [employer] [phone]
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
## 2018-11-27 ENCOUNTER — Inpatient Hospital Stay (HOSPITAL_COMMUNITY): Payer: BLUE CROSS/BLUE SHIELD

## 2018-11-27 LAB — CBC
HCT: 34.8 % — ABNORMAL LOW (ref 39.0–52.0)
Hemoglobin: 11.9 g/dL — ABNORMAL LOW (ref 13.0–17.0)
MCH: 30.8 pg (ref 26.0–34.0)
MCHC: 34.2 g/dL (ref 30.0–36.0)
MCV: 90.2 fL (ref 80.0–100.0)
Platelets: 118 10*3/uL — ABNORMAL LOW (ref 150–400)
RBC: 3.86 MIL/uL — ABNORMAL LOW (ref 4.22–5.81)
RDW: 12.4 % (ref 11.5–15.5)
WBC: 6.6 10*3/uL (ref 4.0–10.5)
nRBC: 0 % (ref 0.0–0.2)

## 2018-11-27 MED ORDER — ACETAMINOPHEN 500 MG PO TABS: ORAL_TABLET | ORAL | 0 refills | Status: AC

## 2018-11-27 MED ORDER — IBUPROFEN 200 MG PO TABS: ORAL_TABLET | ORAL | Status: AC

## 2018-11-27 MED ORDER — IBUPROFEN 200 MG PO TABS: 600.0000 mg | ORAL_TABLET | Freq: Four times a day (QID) | ORAL | Status: DC | PRN

## 2018-11-27 MED ORDER — OXYCODONE HCL 10 MG PO TABS: 5.0000 mg | ORAL_TABLET | ORAL | 0 refills | Status: AC | PRN
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
## 2018-11-29 ENCOUNTER — Encounter: Payer: Self-pay | Admitting: Urgent Care

## 2018-12-03 ENCOUNTER — Ambulatory Visit
Admission: RE | Admit: 2018-12-03 | Discharge: 2018-12-03 | Disposition: A | Discharge status: Home / Self Care | Payer: BLUE CROSS/BLUE SHIELD | Source: Ambulatory Visit | Attending: General Surgery | Admitting: General Surgery

## 2018-12-03 ENCOUNTER — Other Ambulatory Visit: Payer: Self-pay | Admitting: General Surgery

## 2018-12-03 DIAGNOSIS — J939 Pneumothorax, unspecified: Secondary | ICD-10-CM
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
## 2020-07-17 IMAGING — DX PORTABLE CHEST - 1 VIEW
1 series · 1 of 1 positions shown · non-contrast
Comparison: Chest x-ray 11/24/2018

CLINICAL DATA: Follow-up pneumothorax.

EXAM:
PORTABLE CHEST 1 VIEW

[chest ap]
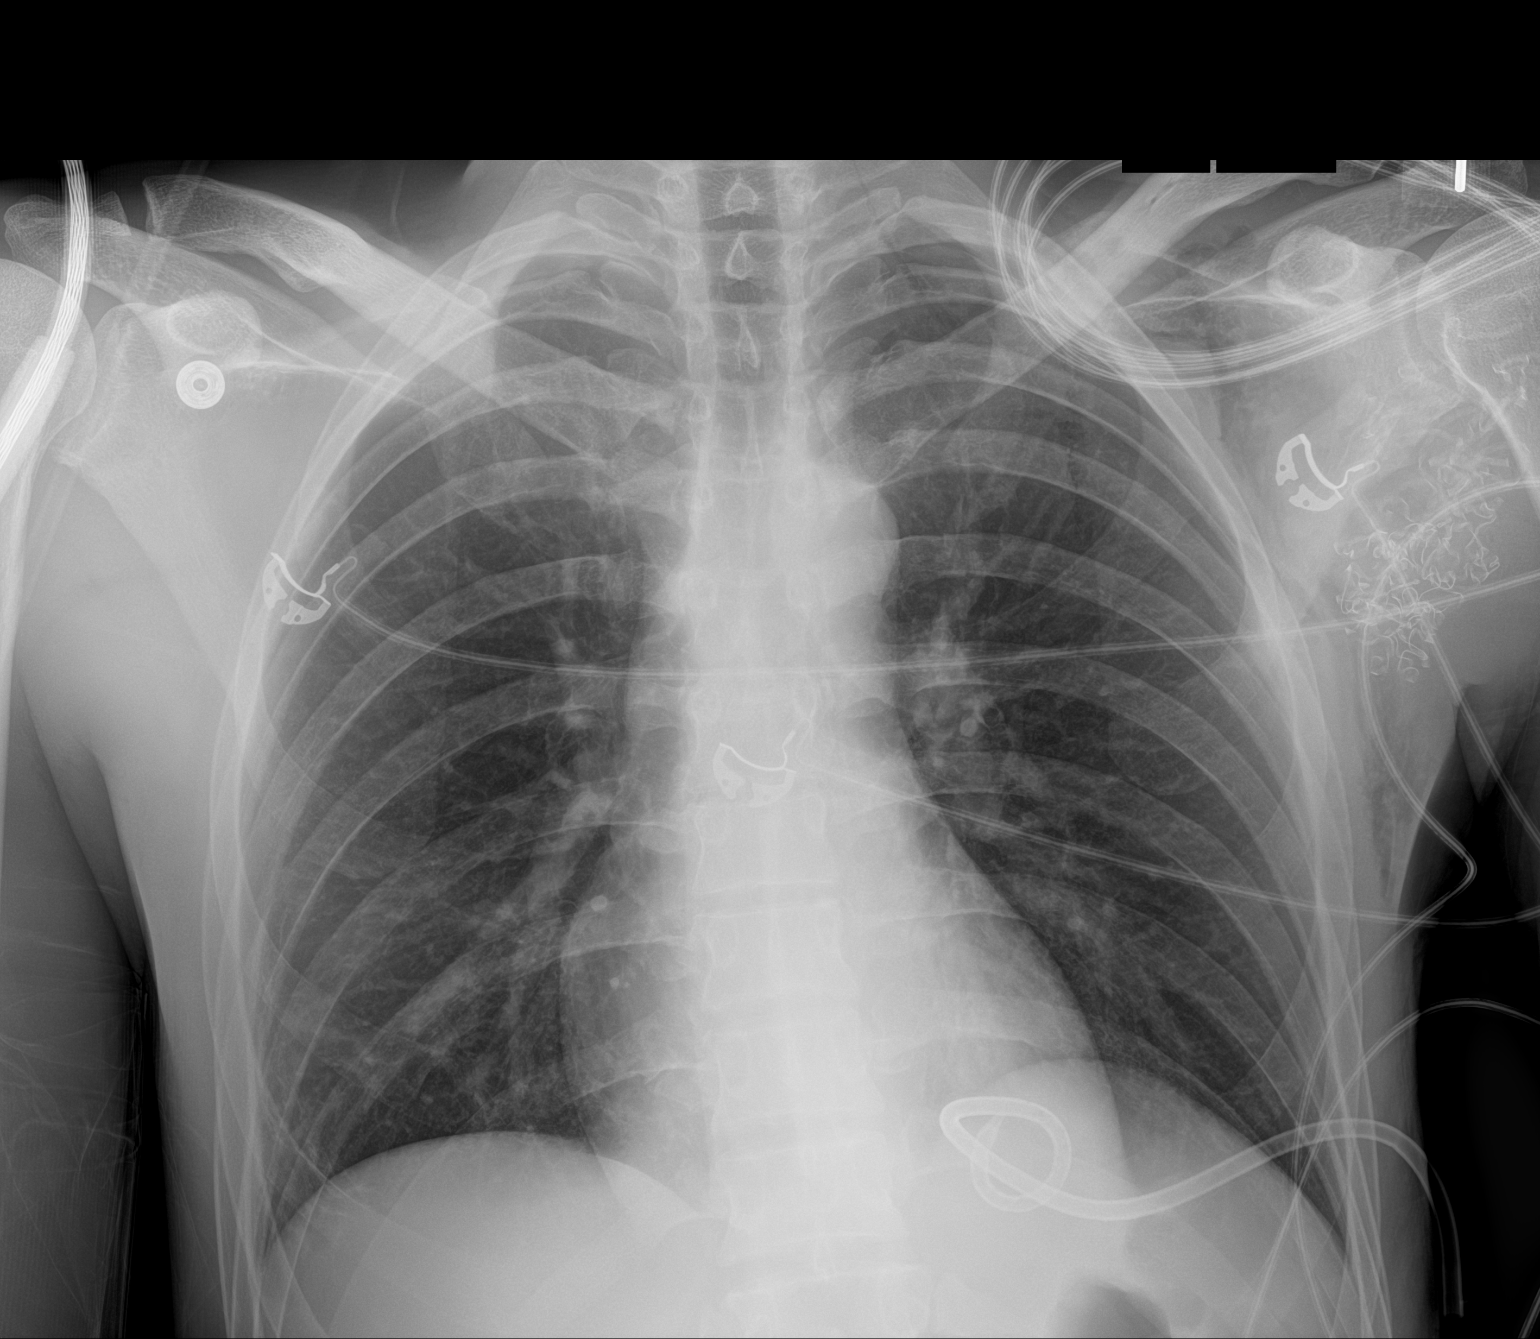

[1 of 1 positions shown; findings below may reference images not displayed]

FINDINGS: The left basilar chest tube is in good position. There is a tiny
residual left apical pneumothorax. No infiltrates or effusions. The
right lung is clear. The cardiomediastinal contours are normal in
stable.
IMPRESSION: Left-sided chest tube in good position with only a tiny residual
apical pneumothorax.

## 2020-07-19 IMAGING — DX PORTABLE CHEST - 1 VIEW
2 series · 2 of 2 positions shown · non-contrast
Comparison: 11/26/2018 and prior studies

CLINICAL DATA: Followup LEFT pneumothorax.

EXAM:
PORTABLE CHEST 1 VIEW

[chest ap (1 of 2)]
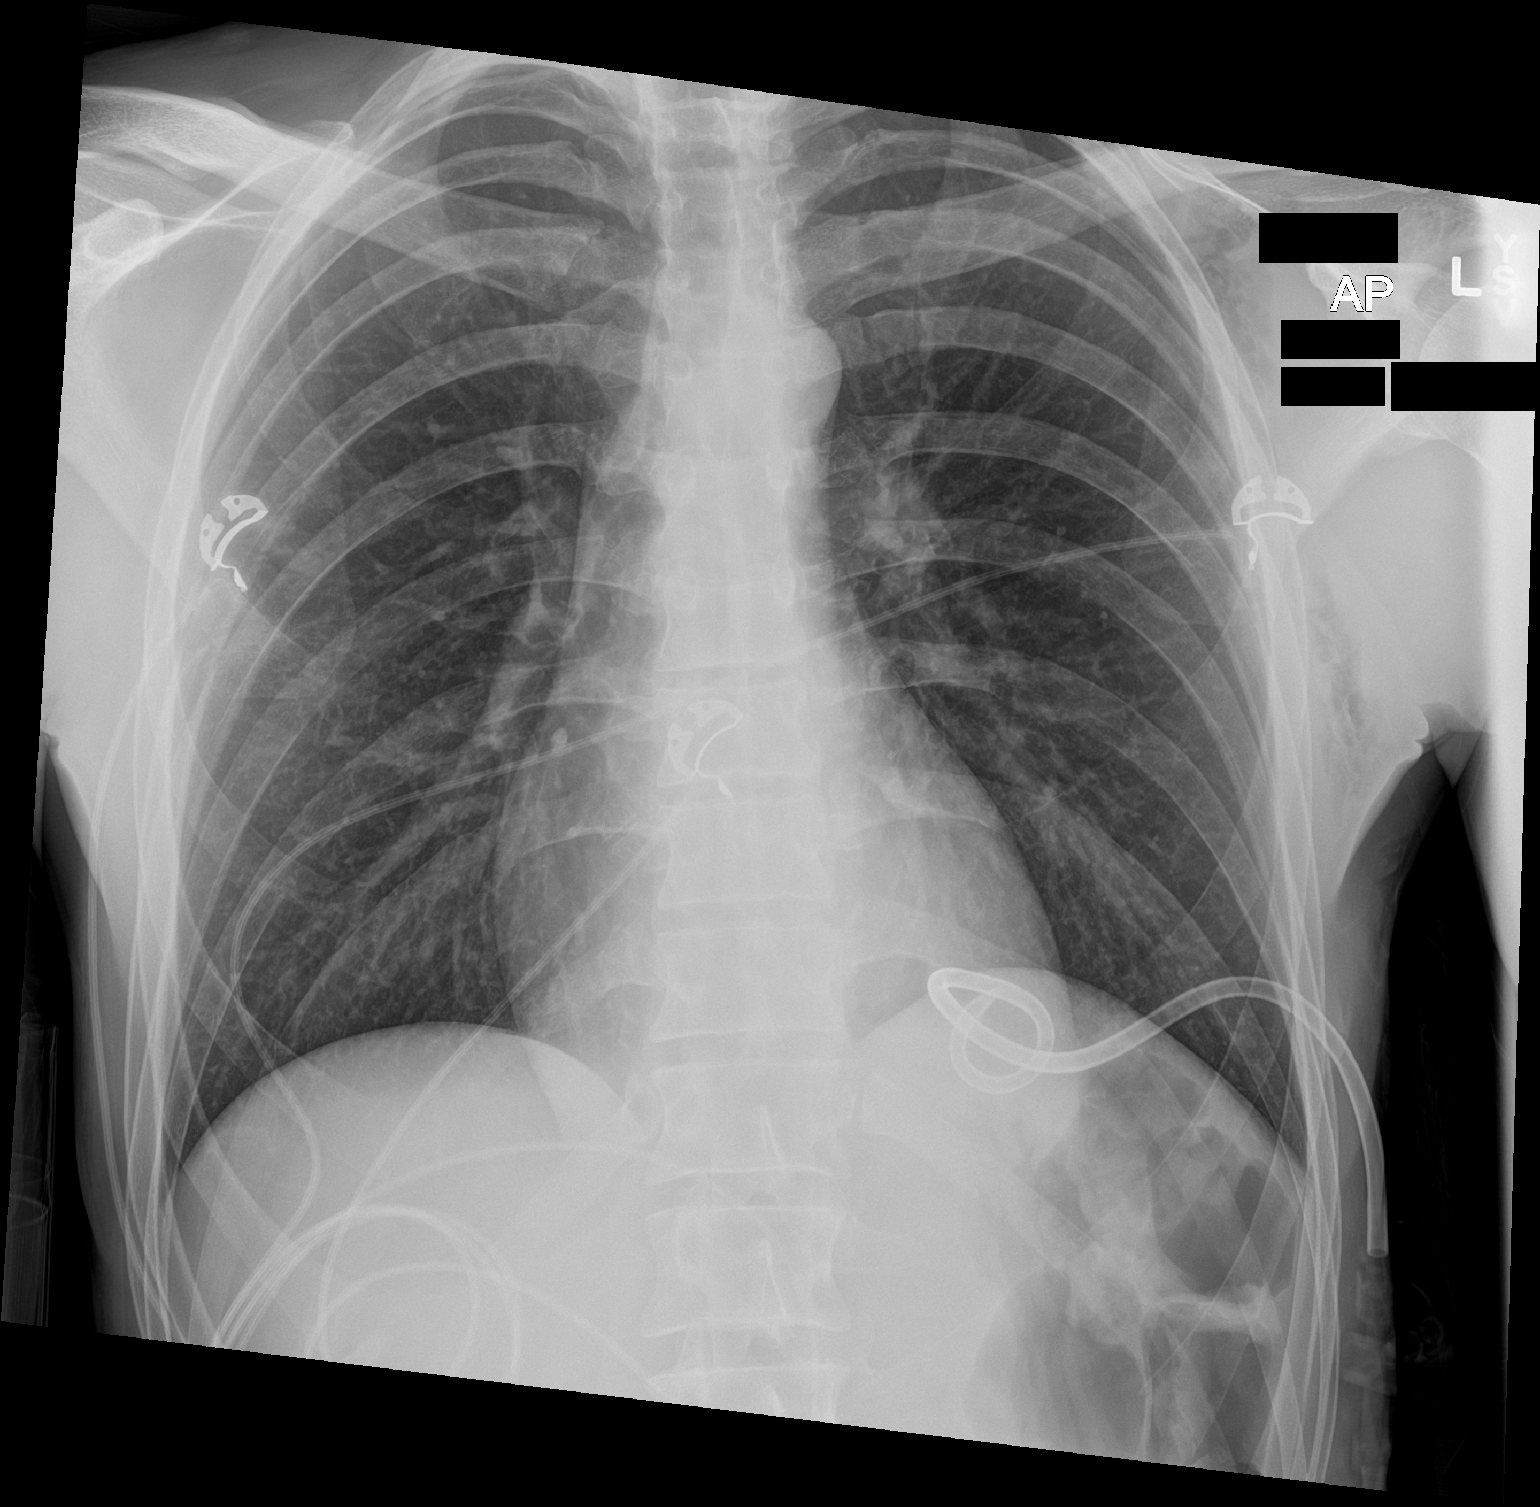

[chest ap (2 of 2)]
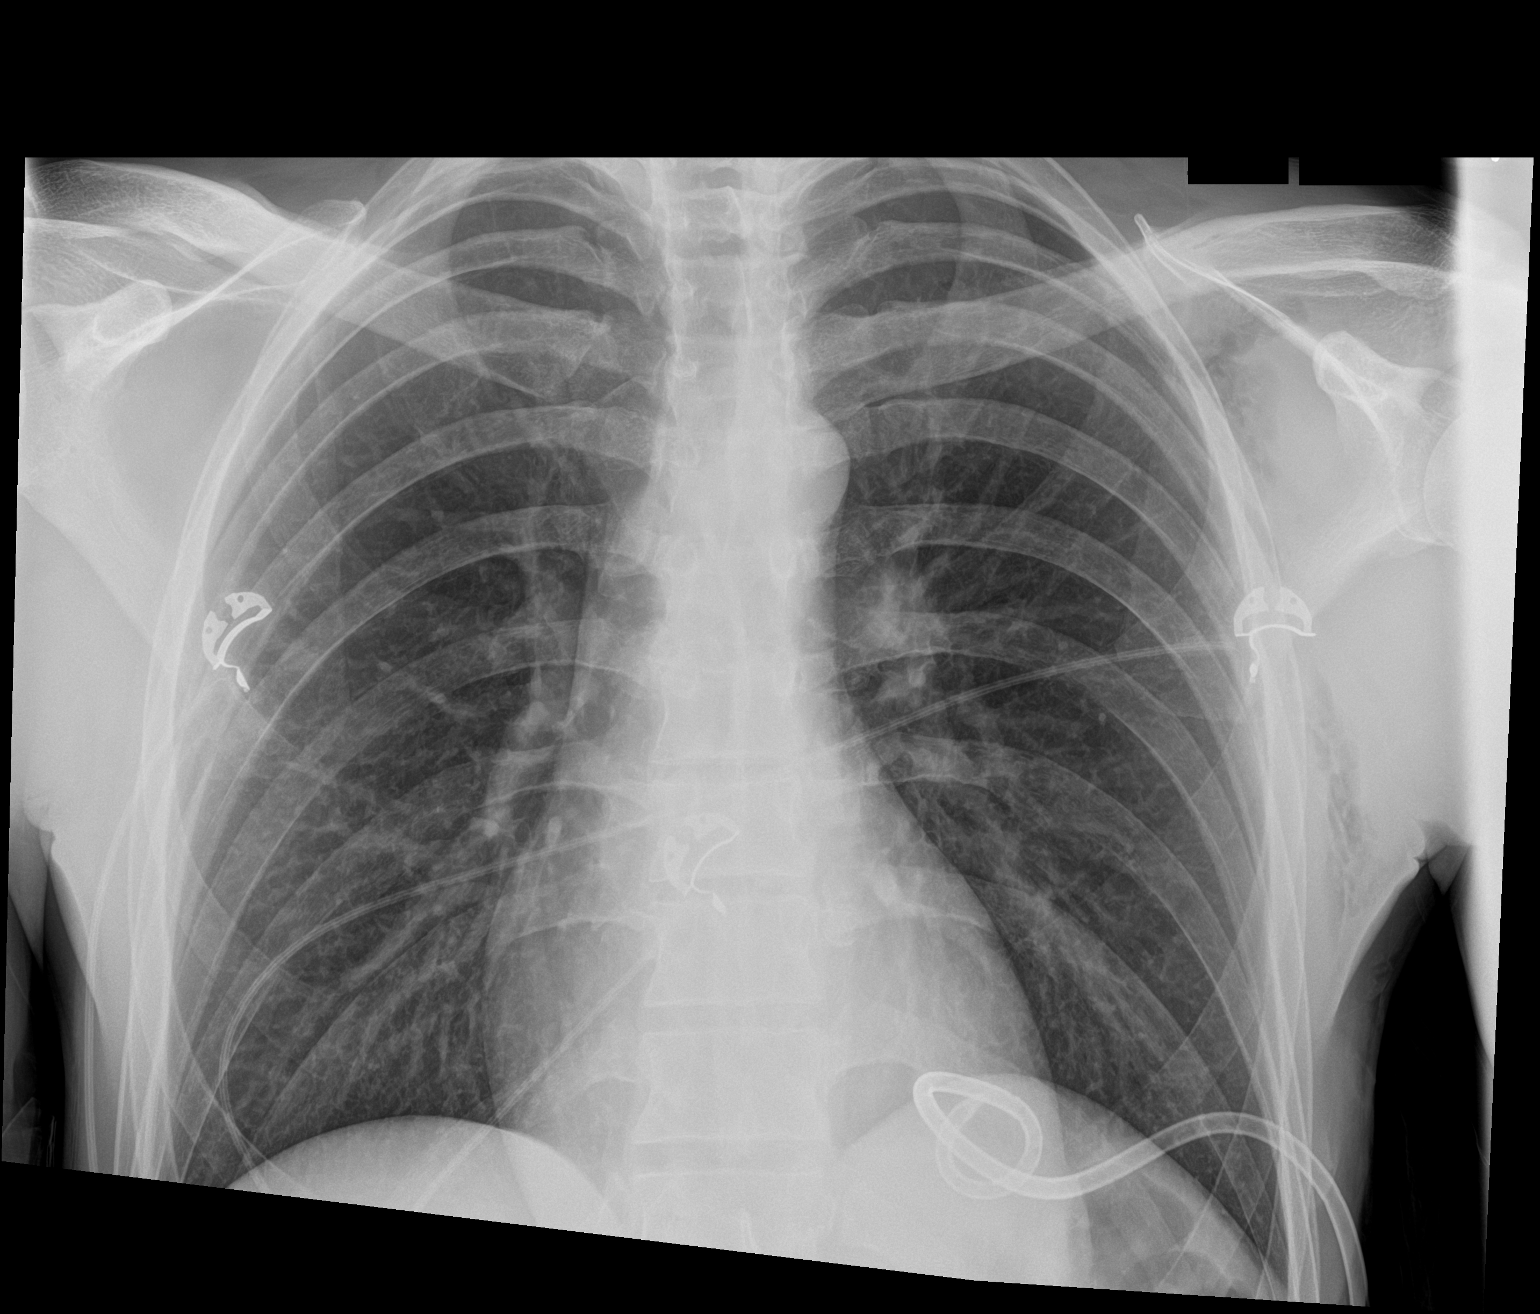

[2 of 2 positions shown; findings below may reference images not displayed]

FINDINGS: The cardiomediastinal silhouette is unremarkable.

A pigtail LEFT thoracostomy catheter is noted inferiorly.

No definite pneumothorax identified.

LEFT subcutaneous emphysema again noted.

No airspace disease or pleural effusion.
IMPRESSION: LEFT thoracostomy catheter again noted.  No definite pneumothorax.

## 2020-07-19 IMAGING — DX CHEST  1 VIEW
2 series · 2 of 2 positions shown · non-contrast
Comparison: Earlier the same date.

CLINICAL DATA: Left pneumothorax post chest tube removal.

EXAM:
CHEST  1 VIEW

[chest ap (1 of 2)]
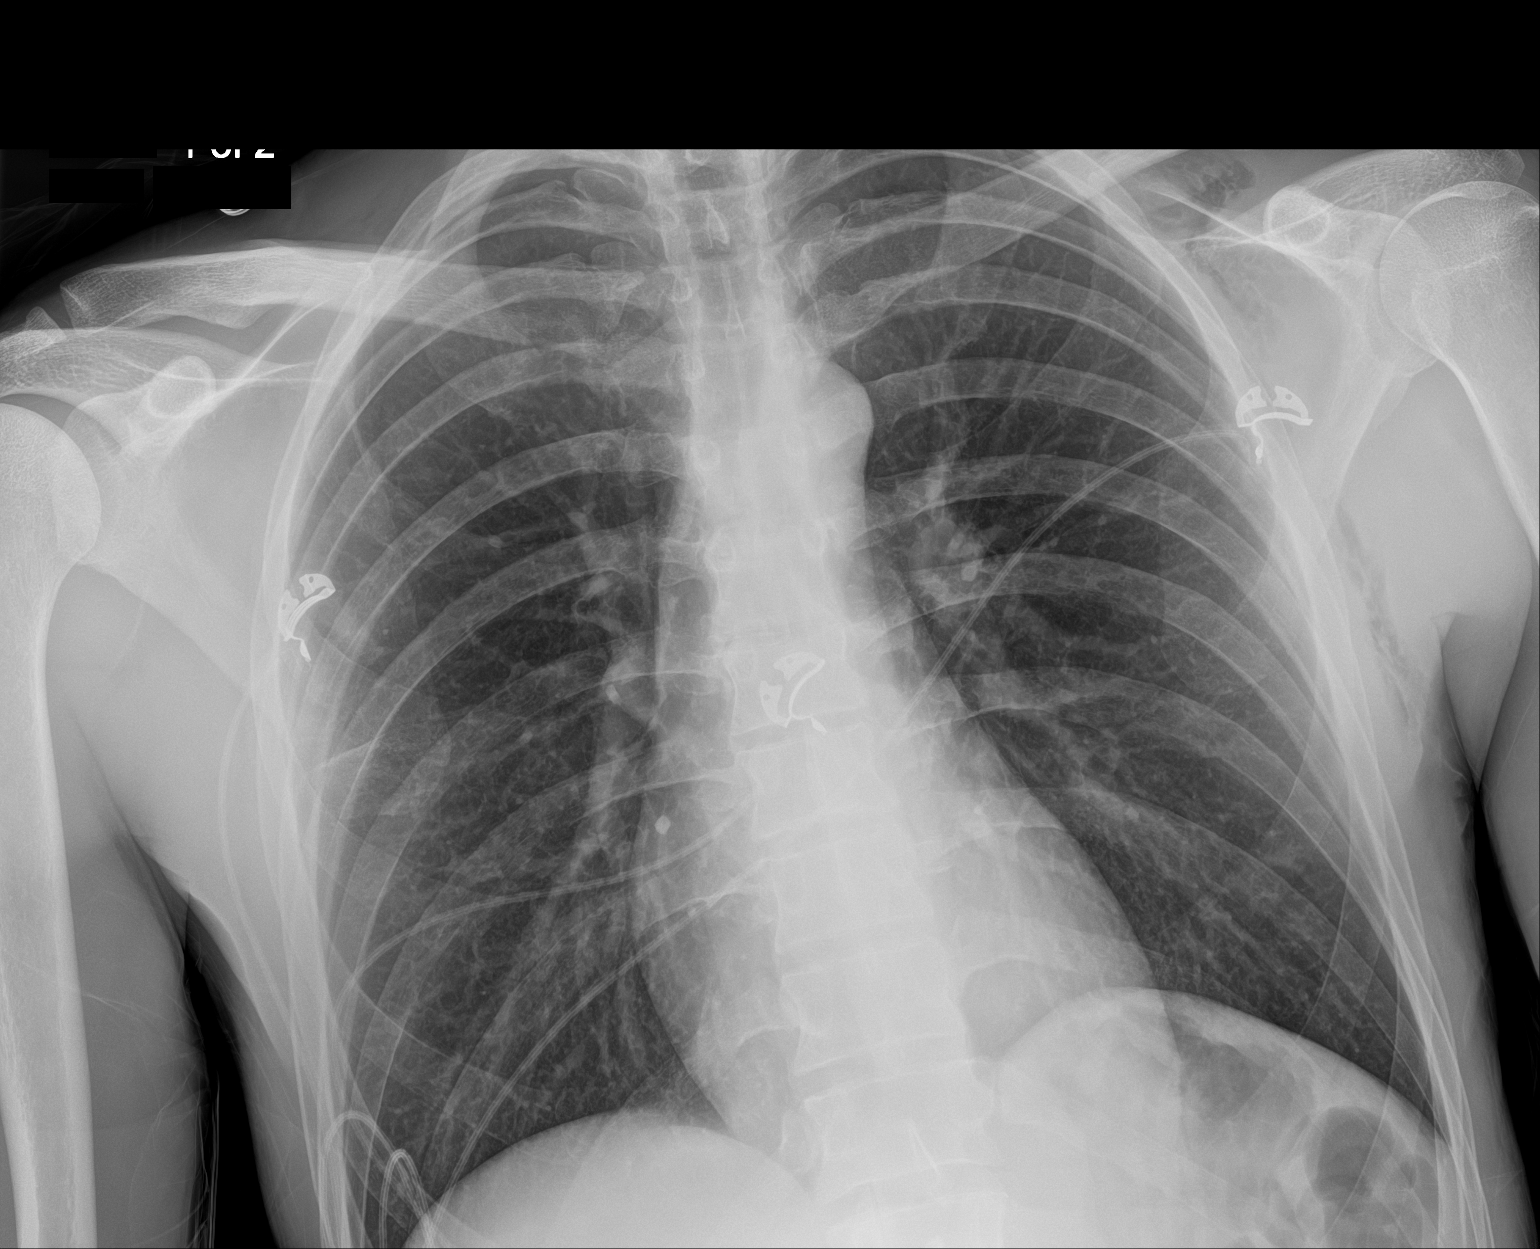

[chest ap (2 of 2)]
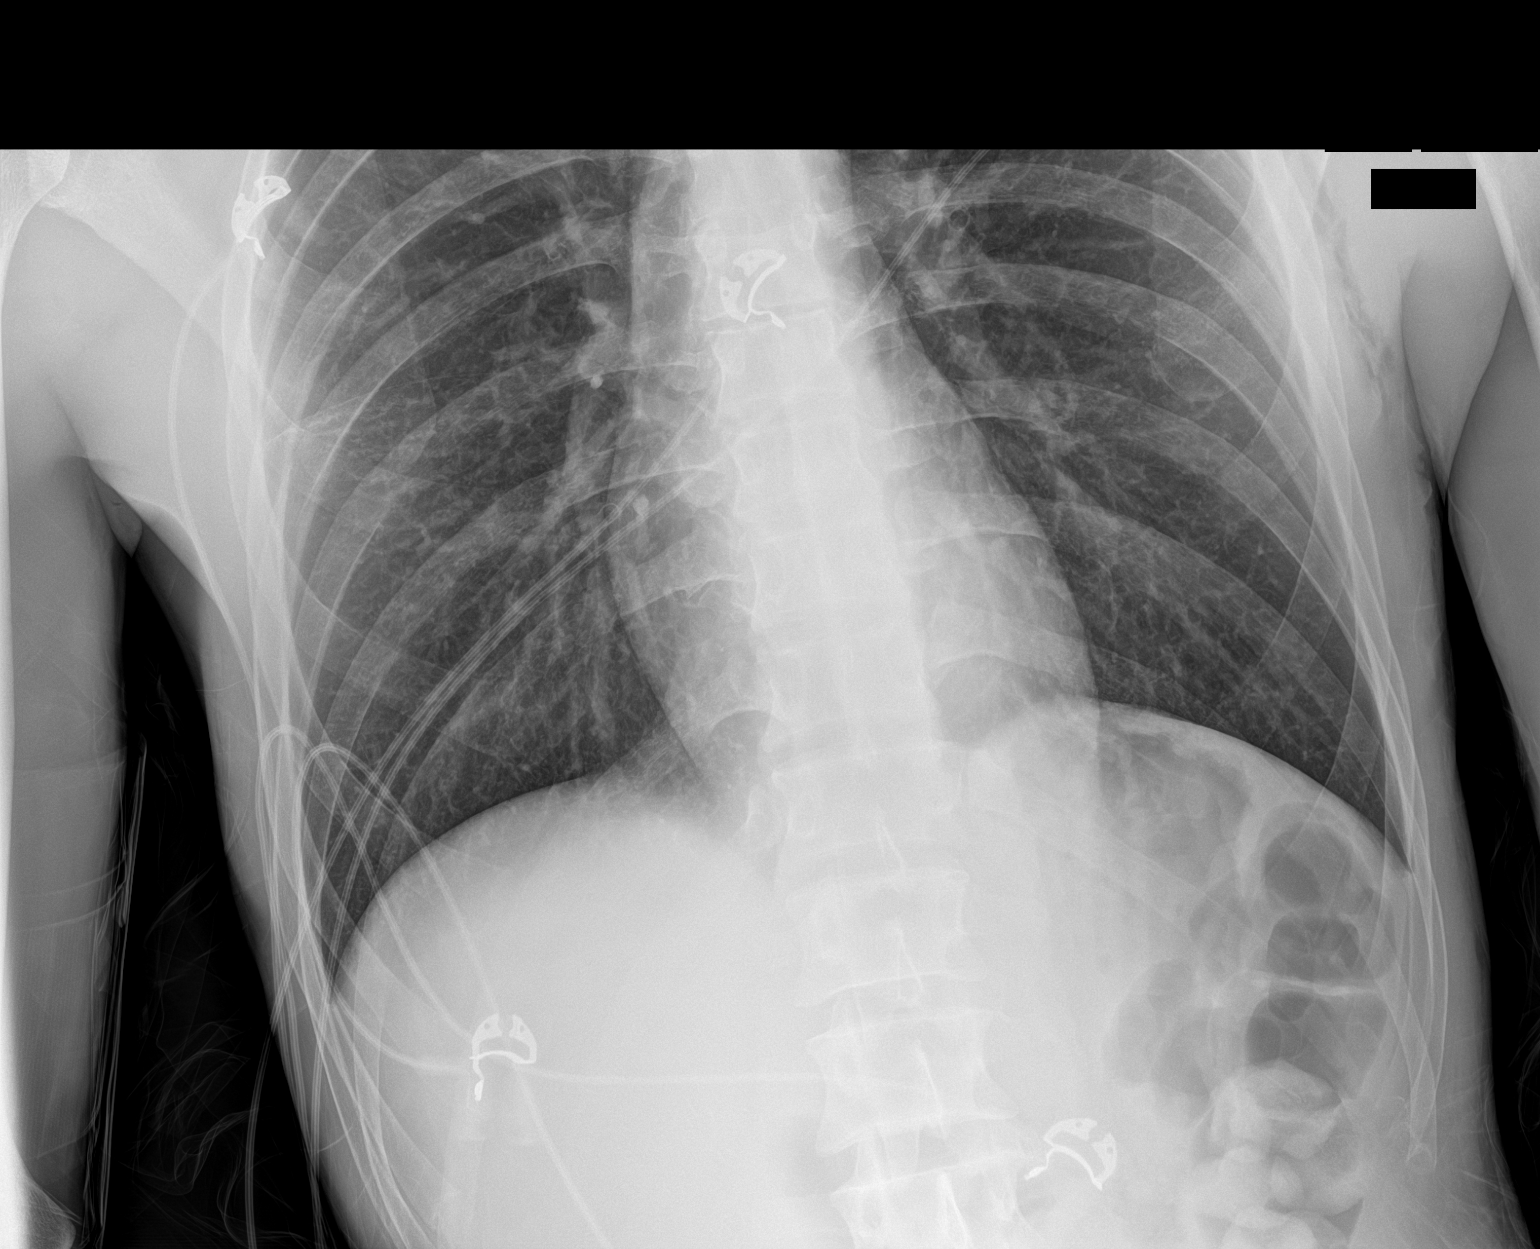

[2 of 2 positions shown; findings below may reference images not displayed]

FINDINGS: Cardiomediastinal silhouette is normal. Mediastinal contours appear
intact.

There is no evidence of focal airspace consolidation, pleural
effusion or pneumothorax, post left chest tube removal.

Persistent soft tissue emphysema of the left chest wall.
IMPRESSION: No evidence of pneumothorax status post left chest tube removal.
